# Patient Record
Sex: Female | Born: 1955 | Race: White | Hispanic: No | Smoking: Never smoker
Health system: Southern US, Community
[De-identification: ages and names within clinical notes are randomized; demographics above are authoritative.]

## PROBLEM LIST (undated history)

## (undated) DIAGNOSIS — C189 Malignant neoplasm of colon, unspecified: Secondary | ICD-10-CM

## (undated) HISTORY — PX: APPENDECTOMY: SHX54

## (undated) HISTORY — PX: CHOLECYSTECTOMY: SHX55

## (undated) HISTORY — DX: Malignant neoplasm of colon, unspecified: C18.9

## (undated) HISTORY — PX: ABDOMINAL HYSTERECTOMY: SHX81

## (undated) HISTORY — PX: TONSILLECTOMY: SUR1361

---

## 2007-02-12 ENCOUNTER — Emergency Department (HOSPITAL_COMMUNITY): Admission: EM | Admit: 2007-02-12 | Discharge: 2007-02-12 | Payer: Self-pay | Admitting: Emergency Medicine

## 2007-02-22 ENCOUNTER — Ambulatory Visit (HOSPITAL_COMMUNITY): Admission: RE | Admit: 2007-02-22 | Discharge: 2007-02-22 | Payer: Self-pay | Admitting: General Surgery

## 2007-02-22 ENCOUNTER — Encounter (INDEPENDENT_AMBULATORY_CARE_PROVIDER_SITE_OTHER): Payer: Self-pay | Admitting: General Surgery

## 2008-02-07 DIAGNOSIS — C189 Malignant neoplasm of colon, unspecified: Secondary | ICD-10-CM

## 2008-02-07 HISTORY — DX: Malignant neoplasm of colon, unspecified: C18.9

## 2008-03-09 HISTORY — PX: PARTIAL COLECTOMY: SHX5273

## 2008-07-02 ENCOUNTER — Ambulatory Visit (HOSPITAL_COMMUNITY): Admission: RE | Admit: 2008-07-02 | Discharge: 2008-07-02 | Payer: Self-pay | Admitting: Family Medicine

## 2009-07-12 ENCOUNTER — Ambulatory Visit (HOSPITAL_COMMUNITY): Admission: RE | Admit: 2009-07-12 | Discharge: 2009-07-12 | Payer: Self-pay | Admitting: Family Medicine

## 2010-06-21 ENCOUNTER — Other Ambulatory Visit (HOSPITAL_COMMUNITY): Payer: Self-pay | Admitting: Family Medicine

## 2010-06-21 DIAGNOSIS — Z139 Encounter for screening, unspecified: Secondary | ICD-10-CM

## 2010-06-21 NOTE — Op Note (Signed)
[name], [name]               ACCOUNT NO.:  [id number]   MEDICAL RECORD NO.:  [id number]          PATIENT TYPE:  AMB   LOCATION:  DAY                           FACILITY:  [facility]   PHYSICIAN:  Tilford Pillar, MD      DATE OF BIRTH:  1956-01-03   DATE OF PROCEDURE:  02/22/2007  DATE OF DISCHARGE:                               OPERATIVE REPORT   PREOPERATIVE DIAGNOSIS:  Cholelithiasis (sludge).   POSTOPERATIVE DIAGNOSIS:  Cholelithiasis (sludge).   PROCEDURE:  Laparoscopic cholecystectomy.   SURGEON:  Tilford Pillar, MD.   ANESTHESIA:  1. General endotracheal.  2. Local anesthetic 1% Sensorcaine plain.   ESTIMATED BLOOD LOSS:  Less than 100 mL.   SPECIMEN:  Gallbladder.   INDICATIONS:  The patient is a 55 year old female who presented to my  office as an outpatient with a history of symptomatology consistent with  biliary colic.  She had been evaluated and was determined to have what  was suspected to be small gallbladder stones, a.k.a sludge.  There was  discussion with the patient that although a majority of her symptoms  were consistent with biliary disease that some of her symptomatology may  be from other upper airway gastrointestinal etiologies and that complete  resolution of all her symptoms may not be possible from cholecystectomy.  However, the majority should improve.  The patient's questions and  concerns were addressed after the risks, benefits and alternatives for  laparoscopic possible open cholecystectomy were discussed with the  patient including, but not limited to, the possibility of infection,  bleeding, bile leak, small bowel injury or common bile duct injury as  well as the possibility of intraoperative cardiac or pulmonary events.  The patient was consented for the planned procedure.   OPERATION:  The patient was taken to the operating room, was placed in  the supine position on the operating table.  At this time, a general  anesthetic was administered  by Anesthesia.  At this point, the patient  was endotracheally intubated by Anesthesia and then her abdomen was  prepped and draped in the usual fashion.  At this time a stab incision  was created supraumbilically with an 11-blade scalpel.  Initial  dissection down to subcuticular tissue was carried out using a Kocher  clamp which was utilized to grasp the anterior abdominal wall fascia and  lift this anteriorly.  The Veress needle was inserted.  A saline drop  test was utilized to confirm intraperitoneal placement and  pneumoperitoneum was initiated.  Once sufficient pneumoperitoneum was  obtained the Veress needle was removed.  An 11 mm trocar was placed  through the umbilical trocar site and the umbilical incision over the  laparoscope allowing visualization of the trocar entry into the  abdominal cavity.  At this point the inner cannula was removed, the  laparoscope was reinserted, there was no evidence of any Veress needle  or trocar placement injury.  At this time, there were several adhesions  within this area and these were bluntly stripped using the camera to  gently widely lyse some of the adhesions in this area,  allowing for a  large enough window in the thin omental adhesions to allow visualization  of the surgical field.  At this point, the remaining trocars were placed  with an 11 mm trocar in the epigastrium, a 5 mm trocar in the midline  between the two 11 mm trocars, and a 5 mm trocar placed in the right  lateral abdominal wall.  At this point, the patient was placed in the  head up left lateral decubitus position.  The gallbladder fundus was  grasped with the radial grasper and lifted up and over the liver.  During this, a small cholecystotomy was created allowing some bile to  escape.  This was regrasped with a grasper to minimize the spillage of  bile.  At this point, the infundibulum was clearly identified.  The  peritoneum was bluntly stripped using a round dissector  off of the  infundibulum, allowing identification of the cystic duct as it entered  into the infundibulum.  A window was created behind the cystic duct.  Three Endoclips were placed proximally, one distally and this was  __________ divided between the two most distal clips.  Similarly, the  cystic artery was identified, a window was created behind this, two  Endoclips were placed proximally, one distally and the cystic artery was  divided between two most distal clips.  Electrocautery was then brought  into the field and was utilized to dissect the gallbladder free from the  gallbladder fossa.  Hemostasis was obtained using electrocautery.  At  this point, the gallbladder was placed in the EndoCatch bag which was  placed __________ right lobe of the liver.  At this point, a suction  catheter was brought into the field allowing aspiration of the spilled  bile.  Hemostasis was excellent although a piece of Surgicel was still  placed into the gallbladder fossa at this point.   At this point the patient was returned to the supine position.  An  Endoclose suture passing device was utilized to pass a #2-0 Vicryl at  both of the 11 mm trocar sites.  With these two sutures in place the  EndoCatch bag was grasped and was pulled through the epigastric trocar  site.  The gallbladder was removed in the intact EndoCatch bag and was  placed on the back table as a permanent specimen.  At this point,  pneumoperitoneum was evacuated, all trocars removed and the Vicryl  sutures were secure.  The skin edges were reapproximated with a #4-0  Monocryl and a running subcuticular suture.  Local anesthetic was  instilled and the skin was washed and dried with a moistened dry towel.  Benzoin was applied around all 4 trocar sites.  Half inch  Steri-Strips  were then placed over the incision and the drapes were removed.  The  patient was allowed to come out of general anesthetic and was  transferred to the Post  Anesthetic Care Unit in stable condition.  At  the conclusion of the procedure all instrument, sponge and needle counts  were correct, the patient tolerated the procedure well.      Tilford Pillar, MD  Electronically Signed     BZ/MEDQ  D:  02/22/2007  T:  02/22/2007  Job:  [id number]   cc:   Primary Care Physician of Record

## 2010-06-21 NOTE — H&P (Signed)
NAMELEILENE, DIPRIMA               ACCOUNT NO.:  [id number]   MEDICAL RECORD NO.:  [id number]          PATIENT TYPE:  AMB   LOCATION:  DAY                           FACILITY:  [facility]   PHYSICIAN:  Tilford Pillar, MD      DATE OF BIRTH:  1955/10/16   DATE OF ADMISSION:  DATE OF DISCHARGE:  LH                              HISTORY & PHYSICAL   CHIEF COMPLAINT:  Epigastric and substernal pain.   HISTORY OF PRESENT ILLNESS:  The patient is a 55 year old female who had  had a several day episode of epigastric and substernal pain.  She had  had similar symptoms in the past but never as significant.  She had  presented to the emergency department, suspecting a cardiac etiology.  This was worked up extensively and was suggested to be biliary in  nature.  She has had a history of symptoms that she had previously felt  to be related to ulcer disease.  She did have a prior workup for H.  pylori which was positive.  She was treated and responded appropriately  with posttreatment evaluation, demonstrating eradication of the H.  pylori.  Since then she has had a trial of Nexium which has worked in  the past for her abdominal pain; however, at this occurrence, she had no  relief.  She has not had any fever or chills.  She has had some nausea  with the pain but no episodes of emesis.  She has had some bloating  sensation with occasional increased flatus.  She has noted no  significant change with any particular food, but does state that p.o.  intake does tend to initially make the pain worse.  She had no relieving  features.  She has denied any changes with bowel habits, no  hematochezia, no melena, no acholic stools, no change with urination, no  hematuria or dysuria.   PAST MEDICAL HISTORY:  Unremarkable.   PAST SURGICAL HISTORY:  1. History of tonsils and adenoidectomies.  2. Appendectomy.  3. She had two C-sections.  4. Hysterectomy.  5. Midline infraumbilical incision.   MEDICATIONS:  She  is not currently on any medications.   ALLERGIES:  No known drug allergies.   SOCIAL HISTORY:  No tobacco use.  Occasional alcohol use.  No  recreational drug use.   PREGNANCIES:  She has had two pregnancies.   FAMILY HISTORY:  Positive for gallbladder disease in a sister and in a  grandmother.   REVIEW OF SYSTEMS:  CONSTITUTIONAL:  Unremarkable.  EYES:  Unremarkable.  EARS, NOSE, AND THROAT:  Occasional rhinorrhea.  RESPIRATORY:  Unremarkable.  CARDIOVASCULAR:  Unremarkable.  GASTROINTESTINAL:  Abdominal pain, nausea, and indigestion as per HPI.  GENITOURINARY:  Unremarkable.  MUSCULOSKELETAL:  Unremarkable.  SKIN:  Unremarkable.  ENDOCRINE:  Occasional cold or heat intolerance.  Occasionally lack of  energy.  NEUROLOGIC:  Unremarkable.   PHYSICAL EXAMINATION:  She is a healthy, although mildly obese female.  She is calm, in no acute distress.Marland Kitchen  HEENT:  Scalp:  No deformities, no masses.  Eyes:  Pupils equal, round,  and reactive.  Extraocular movements intact.  No scleral icterus or  conjunctival pallor is noted.  NECK:  Trachea is midline.  No cervical lymphadenopathy is apparent.  Marland Kitchen  PULMONARY:  Unlabored respiration, no wheezing.  No crackles.  No  rhonchi.  She is clear to auscultation and bilateral lung fields.  CARDIOVASCULAR:  Regular rate and rhythm.  No murmurs or gallops are  appreciated.  She has 2+ radial and dorsalis pedis pulses bilaterally.  ABDOMEN:  Positive bowel sounds.  Abdomen si soft.  She is nontender.  No pain to palpation.  No hernias are appreciated.  No masses are  elicited.  SKIN:  Warm and dry.   LABORATORY DATA:  Previous ultrasound to the right upper quadrant  demonstrated positive sludge.  No evidence of acute inflammation.  No  pericholecystic fluid.  No evidence of any common bile duct dilatation.   ASSESSMENT/PLAN:  Chronic cholecystitis, suspected cholelithiasis  (sludge).  At this point her symptomatology is suggestive of biliary   etiology.  The risks, benefits, and alternatives of a laparoscopic,  possible open cholecystectomy were discussed at length with the patient,  including the risks, but not limited to, infection, bleeding, common  bile duct injury, bile leak, small bowel injury, as well as the  possibility of intraoperative cardiac or pulmonary events.  As the  patient has had a prior history of upper abdominal pain which responded  to Nexium, but was different than the current episodes, it is suspected  she may have additional etiology of some of her symptoms from some  gastroesophageal reflux disease.  At this point I do feel that enough of  her symptoms are consistent with a biliary etiology that I would  recommend gallbladder removal if some of her symptoms do persist  following additional workup would be recommended for additional upper GI  etiology such as gastroesophageal reflux disease.  This was discussed  with the patient and the patient is comfortable with this and wishes to  proceed with surgery.  She will be scheduled at her earliest  convenience.      Tilford Pillar, MD  Electronically Signed     BZ/MEDQ  D:  02/14/2007  T:  02/15/2007  Job:  [id number]   cc:   Tilford Pillar, MD  Fax: [phone]   Dr. Kathyrn Sheriff [facility]

## 2010-07-18 ENCOUNTER — Ambulatory Visit (HOSPITAL_COMMUNITY)
Admission: RE | Admit: 2010-07-18 | Discharge: 2010-07-18 | Disposition: A | Discharge status: Home / Self Care | Payer: PRIVATE HEALTH INSURANCE | Source: Ambulatory Visit | Attending: Family Medicine | Admitting: Family Medicine

## 2010-07-18 DIAGNOSIS — Z139 Encounter for screening, unspecified: Secondary | ICD-10-CM

## 2010-07-18 DIAGNOSIS — Z1231 Encounter for screening mammogram for malignant neoplasm of breast: Secondary | ICD-10-CM | POA: Insufficient documentation

## 2010-10-27 LAB — COMPREHENSIVE METABOLIC PANEL
ALT: 47 — ABNORMAL HIGH
AST: 54 — ABNORMAL HIGH
Alkaline Phosphatase: 74
BUN: 8
CO2: 25
CO2: 26
Calcium: 9
Chloride: 106
GFR calc Af Amer: >60
Glucose, Bld: 108 — ABNORMAL HIGH
Glucose, Bld: 117 — ABNORMAL HIGH
Potassium: 3.6
Sodium: 139
Sodium: 142
Total Bilirubin: 0.6
Total Protein: 6.4

## 2010-10-27 LAB — DIFFERENTIAL
Basophils Absolute: 0
Basophils Relative: 0
Lymphocytes Relative: 19
Monocytes Absolute: 0.5
Monocytes Relative: 8
Neutro Abs: 3.8
Neutro Abs: 4.7

## 2010-10-27 LAB — CBC
HCT: 33.8 — ABNORMAL LOW
HCT: 35.4 — ABNORMAL LOW
HCT: 38.5
Hemoglobin: 11.8 — ABNORMAL LOW
Hemoglobin: 12.4
Hemoglobin: 12.9
MCHC: 35
Platelets: 221
WBC: 6.3

## 2010-10-27 LAB — BASIC METABOLIC PANEL
CO2: 25
Calcium: 9
Creatinine, Ser: 0.63
GFR calc Af Amer: >60
GFR calc non Af Amer: >60
Glucose, Bld: 110 — ABNORMAL HIGH
Potassium: 3.5

## 2010-10-27 LAB — LIPASE, BLOOD: Lipase: 50

## 2010-10-27 LAB — AMYLASE: Amylase: 56

## 2011-03-10 HISTORY — PX: COLONOSCOPY: SHX174

## 2011-07-17 ENCOUNTER — Other Ambulatory Visit (HOSPITAL_COMMUNITY): Payer: Self-pay | Admitting: Family Medicine

## 2011-07-17 DIAGNOSIS — Z139 Encounter for screening, unspecified: Secondary | ICD-10-CM

## 2011-07-21 ENCOUNTER — Ambulatory Visit (HOSPITAL_COMMUNITY)
Admission: RE | Admit: 2011-07-21 | Discharge: 2011-07-21 | Disposition: A | Discharge status: Home / Self Care | Payer: PRIVATE HEALTH INSURANCE | Source: Ambulatory Visit | Attending: Family Medicine | Admitting: Family Medicine

## 2011-07-21 DIAGNOSIS — Z139 Encounter for screening, unspecified: Secondary | ICD-10-CM

## 2011-07-21 DIAGNOSIS — Z1231 Encounter for screening mammogram for malignant neoplasm of breast: Secondary | ICD-10-CM | POA: Insufficient documentation

## 2011-08-30 ENCOUNTER — Encounter (HOSPITAL_COMMUNITY): Payer: PRIVATE HEALTH INSURANCE | Attending: Oncology | Admitting: Oncology

## 2011-08-30 ENCOUNTER — Encounter (HOSPITAL_COMMUNITY): Payer: Self-pay | Admitting: Oncology

## 2011-08-30 ENCOUNTER — Telehealth (HOSPITAL_COMMUNITY): Payer: Self-pay | Admitting: Oncology

## 2011-08-30 VITALS — BP 140/72 | HR 74 | Temp 98.2°F | Ht 62.5 in | Wt 158.5 lb

## 2011-08-30 DIAGNOSIS — Z09 Encounter for follow-up examination after completed treatment for conditions other than malignant neoplasm: Secondary | ICD-10-CM | POA: Insufficient documentation

## 2011-08-30 DIAGNOSIS — C189 Malignant neoplasm of colon, unspecified: Secondary | ICD-10-CM

## 2011-08-30 DIAGNOSIS — Z85038 Personal history of other malignant neoplasm of large intestine: Secondary | ICD-10-CM | POA: Insufficient documentation

## 2011-08-30 DIAGNOSIS — C180 Malignant neoplasm of cecum: Secondary | ICD-10-CM

## 2011-08-30 DIAGNOSIS — Z808 Family history of malignant neoplasm of other organs or systems: Secondary | ICD-10-CM

## 2011-08-30 LAB — COMPREHENSIVE METABOLIC PANEL
ALT: 33 U/L (ref 0–35)
AST: 29 U/L (ref 0–37)
Albumin: 4.8 g/dL (ref 3.5–5.2)
Alkaline Phosphatase: 75 U/L (ref 39–117)
Calcium: 8.2 mg/dL — ABNORMAL LOW (ref 8.4–10.5)
Chloride: 100 mEq/L (ref 96–112)
GFR calc Af Amer: >90 mL/min (ref >90)
GFR calc non Af Amer: >90 mL/min (ref >90)
Glucose, Bld: 79 mg/dL (ref 70–99)
Potassium: 3.8 mEq/L (ref 3.5–5.1)
Sodium: 139 mEq/L (ref 135–145)
Total Bilirubin: 0.4 mg/dL (ref 0.3–1.2)
Total Protein: 7.6 g/dL (ref 6.0–8.3)

## 2011-08-30 LAB — CBC
HCT: 41.9 % (ref 36.0–46.0)
MCHC: 34.8 g/dL (ref 30.0–36.0)
MCV: 86.4 fL (ref 78.0–100.0)
Platelets: 243 10*3/uL (ref 150–400)

## 2011-08-30 NOTE — Progress Notes (Signed)
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 Kinnie Feil at [facility]. In January 2010 she had a screening colonoscopy which detected this lesion. Thus far she has no evidence of recurrent disease and pathological staging was a pT1 N0 lesion. Her most recent colonoscopy was 03/30/2011 which showed no polyps no worrisome lesions and no diverticula. CEA levels have been said to be normal throughout her course.  Problem #2 mild excessive weight  Problem #3 history of TAH and BSO for benign disease namely fibroid tumors and excessive uterine bleeding causing chronic anemia.  Problem #4 history of T and A  Problem #5 history 2 C-sections  Problem #6 history of appendectomy  Problem #7 history of laparoscopic cholecystectomy  This is a very pleasant 56 year old Caucasian woman who is a nonsmoker and nondrinker. On routine screening colonoscopy she was found to have a grade 1 minimally invasive adenocarcinoma in a followup of the cecum with resection by Dr. Greggory Stallion at [facility]. Preoperative CT scan was negative for metastatic disease and she is also had a followup CT scan in September of 2011 which was also negative as well. Lab work has been unremarkable since then as well. She feels fine as negative oncologic review of systems. She had been going to the [facility] in [facility] which is closing down and she would like to transfer her care here.  She is the mother of 2 children a daughter and a son ages 63 and 85 respectively. They are in good health. She does however have a mother with a history of breast cancer, Anastasia Pall, who is my patient. Her mother's grandmother died of breast cancer in her 79s and her mother's sister was approximately 43 when diagnosed with breast cancer but is still alive in her 73s. This patient's father has a  history of prostate cancer. Her father is one brother died of lung cancer and one brother died of prostate cancer. The patient herself has a brother age 22 in good health and a sister age 48 in good health.  She works in [place] for a [employer].  She is divorced.  Vital signs are recorded her weight is 158 pounds and her height is 5 feet 2-1/2 inches her BMI is 28.6. She has no lymphadenopathy in the cervical supraclavicular infraclavicular axillary or inguinal nodes. Lungs are clear to auscultation and percussion. She has very tan skin throughout and states that she has some Native American blood in her. She denies use of the tanning bed but works outside in her garden quite often. HEENT exam is unremarkable. Pupils are equally round and reactive to light facial symmetry is intact and dental care is quite intact. She has no thyromegaly. Breast exam is negative for masses. Heart shows a regular rhythm and rate without murmur rub or gallop. Abdomen is soft and nontender without organomegaly all scars are well-healed. There is no distention bowel sounds are very normal. She has no peripheral edema the arms or legs. She is right handed. Pulses are 2+ and symmetrical. She is in no acute distress.  We are happy to follow her. She will have a colonoscopy she states 3 years from this past February. She is not sure where she will have it done but may consider one of the gastroenterologists locally. We will do her blood work today and see her back  in 6 months. If all is well then she will be out 4 full years from surgery and we may just go to lab work every 6 months with a followup appointment in 12 months or we may just see her every 6 months for another year. We may then just see her once a year.  I think the other issue I would like to consider is talking to our genetics counselor about her family history to see if she feels that she should be tested. She certainly needs annual mammography  which she is doing. I will see her in 6 months. She will call us next Tuesday to discuss the genetics issue since our counselor is out of the office this week.

## 2011-08-30 NOTE — Patient Instructions (Addendum)
[facility]  Discharge Instructions [name]  [id number] November 27, 1955 Dr. Glenford Peers RECOMMENDATIONS MADE BY THE CONSULTANT AND ANY TEST RESULTS WILL BE SENT TO YOUR REFERRING DOCTOR.   EXAM FINDINGS BY MD TODAY AND SIGNS AND SYMPTOMS TO REPORT TO CLINIC OR PRIMARY MD:  Exam per Dr. Mariel Sleet  INSTRUCTIONS GIVEN AND DISCUSSED: Consider for genetic counseling --we will talk to geneticist Call us next Tuesday for decision  SPECIAL INSTRUCTIONS/FOLLOW-UP: Labs today Call us on Friday for lab result Cea every 6 months  I acknowledge that I have been informed and understand all the instructions given to me and received a copy. I do not have any more questions at this time, but understand that I may call the Specialty Clinic at [facility] at [phone] during business hours should I have any further questions or need assistance in obtaining follow-up care.    __________________________________________  _____________  __________ Signature of Patient or Authorized Representative            Date                   Time    __________________________________________ Nurse's Signature

## 2011-08-30 NOTE — Telephone Encounter (Signed)
[employer]-[phone] PER AUTO CSR PTS PLAN IS EFFECTIVE. 02/06/10 TO PRESENT

## 2011-08-31 LAB — CEA: CEA: <0.5 ng/mL (ref 0.0–5.0)

## 2011-09-01 ENCOUNTER — Telehealth (HOSPITAL_COMMUNITY): Payer: Self-pay | Admitting: *Deleted

## 2011-09-01 NOTE — Telephone Encounter (Signed)
Pt notified to take one oscal daily due to calcium level 8.2.

## 2012-03-01 ENCOUNTER — Encounter (HOSPITAL_COMMUNITY): Payer: PRIVATE HEALTH INSURANCE | Attending: Oncology | Admitting: Oncology

## 2012-03-01 VITALS — BP 154/84 | HR 65 | Temp 97.4°F | Resp 18 | Wt 166.1 lb

## 2012-03-01 DIAGNOSIS — Z85038 Personal history of other malignant neoplasm of large intestine: Secondary | ICD-10-CM | POA: Insufficient documentation

## 2012-03-01 DIAGNOSIS — Z09 Encounter for follow-up examination after completed treatment for conditions other than malignant neoplasm: Secondary | ICD-10-CM | POA: Insufficient documentation

## 2012-03-01 DIAGNOSIS — E663 Overweight: Secondary | ICD-10-CM | POA: Insufficient documentation

## 2012-03-01 DIAGNOSIS — C189 Malignant neoplasm of colon, unspecified: Secondary | ICD-10-CM

## 2012-03-01 LAB — CBC WITH DIFFERENTIAL/PLATELET
Basophils Relative: 1 % (ref 0–1)
Lymphs Abs: 2 10*3/uL (ref 0.7–4.0)
MCHC: 34.2 g/dL (ref 30.0–36.0)
Neutro Abs: 3.8 10*3/uL (ref 1.7–7.7)
RBC: 4.73 MIL/uL (ref 3.87–5.11)
RDW: 12.6 % (ref 11.5–15.5)
WBC: 6.2 10*3/uL (ref 4.0–10.5)

## 2012-03-01 LAB — COMPREHENSIVE METABOLIC PANEL
BUN: 13 mg/dL (ref 6–23)
CO2: 27 mEq/L (ref 19–32)
Chloride: 101 mEq/L (ref 96–112)
Creatinine, Ser: 0.68 mg/dL (ref 0.50–1.10)
GFR calc non Af Amer: >90 mL/min (ref >90)
Glucose, Bld: 82 mg/dL (ref 70–99)
Sodium: 139 mEq/L (ref 135–145)

## 2012-03-01 NOTE — Patient Instructions (Addendum)
.  [facility] Discharge Instructions  RECOMMENDATIONS MADE BY THE CONSULTANT AND ANY TEST RESULTS WILL BE SENT TO YOUR REFERRING PHYSICIAN.  EXAM FINDINGS BY THE PHYSICIAN TODAY AND SIGNS OR SYMPTOMS TO REPORT TO CLINIC OR PRIMARY PHYSICIAN: Exam is good Discussed weight loss for better health and to decrease cancer risk    SPECIAL INSTRUCTIONS/FOLLOW-UP: cea in 6 months See [place] back in 1 year Report UNEXPLAINED weight loss, loss of appetite, abd pain that persists  Thank you for choosing [facility] to provide your oncology and hematology care.  To afford each patient quality time with our providers, please arrive at least 15 minutes before your scheduled appointment time.  With your help, our goal is to use those 15 minutes to complete the necessary work-up to ensure our physicians have the information they need to help with your evaluation and healthcare recommendations.    Effective January 1st, 2014, we ask that you re-schedule your appointment with our physicians should you arrive 10 or more minutes late for your appointment.  We strive to give you quality time with our providers, and arriving late affects you and other patients whose appointments are after yours.    Again, thank you for choosing [facility].  Our hope is that these requests will decrease the amount of time that you wait before being seen by our physicians.       _____________________________________________________________  Should you have questions after your visit to [facility], please contact our office at [phone] between the hours of 8:30 a.m. and 5:00 p.m.  Voicemails left after 4:30 p.m. will not be returned until the following business day.  For prescription refill requests, have your pharmacy contact our office with your prescription refill request.

## 2012-03-01 NOTE — Progress Notes (Signed)
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 Kinnie Feil at [facility]. In January 2010 she is screening colonoscopy which detected this lesion. She had presented with abdominal discomfort intermittently he was found an issue that H. pylori infection, then gallbladder disease with dysfunction. The latter was removed surgically and then the colon cancer was found. She has thus far no evidence for recurrence and her most recent colonoscopy was 03/30/2011 and she will need another colonoscopy 3 years and then. We will need to refer her to our local gastroenterologist. Problem #2 [place] excessive weight having gained 8 more pounds since being here in July and we talked once again about what she needs to do. She is not exercising on a regular basis and probably not eating the correct foods completely. I think she is well informed about what she should be heating but not always able to follow through. Problem #3 history 2 C-sections Problem #4 history of a complete Hysterectomy including TAH and BSO for benign disease, namely fibroid tumors and excessive uterine bleeding causing anemia. She states his could not be corrected without the hysterectomy Problem #5 history of a tonsillectomy Problem #6 history of laparoscopic cholecystectomy Problem #7 history of appendectomy Pleasant woman in no acute distress. Review of systems is negative. She's doing well. She is working full-time. She does not feel bad. Unfortunately however her weight is up 8 more pounds and she is 5 feet 2 inches tall  She is in no acute distress as mentioned. She has no lymphadenopathy. Breast exam is negative for masses. Lungs are clear to auscultation and percussion. Heart shows a regular rhythm and rate without murmur rub or gallop. Abdomen remains soft and nontender without again in  [place]. Bowel sounds are normal. She has no peripheral edema.  We will do blood work today and his CEA in 6 months and I will see her in one year. We will then go to followup one time per year for 2 more years. I do not think she needs CAT scans in followup at this time. She promises to work on her exercise, her being habits and her exercise. We will need to get her gastroenterologist consultation but we see her in 12 months.

## 2012-06-13 ENCOUNTER — Other Ambulatory Visit (HOSPITAL_COMMUNITY): Payer: Self-pay | Admitting: Family Medicine

## 2012-06-13 DIAGNOSIS — Z139 Encounter for screening, unspecified: Secondary | ICD-10-CM

## 2012-08-26 ENCOUNTER — Other Ambulatory Visit (HOSPITAL_COMMUNITY): Payer: PRIVATE HEALTH INSURANCE

## 2012-08-26 ENCOUNTER — Ambulatory Visit (HOSPITAL_COMMUNITY): Payer: PRIVATE HEALTH INSURANCE

## 2012-08-27 ENCOUNTER — Encounter (HOSPITAL_COMMUNITY): Payer: PRIVATE HEALTH INSURANCE | Attending: Hematology and Oncology

## 2012-08-27 ENCOUNTER — Ambulatory Visit (HOSPITAL_COMMUNITY)
Admission: RE | Admit: 2012-08-27 | Discharge: 2012-08-27 | Disposition: A | Discharge status: Home / Self Care | Payer: PRIVATE HEALTH INSURANCE | Source: Ambulatory Visit | Attending: Family Medicine | Admitting: Family Medicine

## 2012-08-27 DIAGNOSIS — Z139 Encounter for screening, unspecified: Secondary | ICD-10-CM

## 2012-08-27 DIAGNOSIS — Z1231 Encounter for screening mammogram for malignant neoplasm of breast: Secondary | ICD-10-CM | POA: Insufficient documentation

## 2012-08-27 DIAGNOSIS — C189 Malignant neoplasm of colon, unspecified: Secondary | ICD-10-CM | POA: Insufficient documentation

## 2012-08-27 LAB — CBC
HCT: 41.1 % (ref 36.0–46.0)
MCHC: 34.3 g/dL (ref 30.0–36.0)
MCV: 87.3 fL (ref 78.0–100.0)
Platelets: 229 10*3/uL (ref 150–400)
RBC: 4.71 MIL/uL (ref 3.87–5.11)

## 2012-08-27 LAB — COMPREHENSIVE METABOLIC PANEL
ALT: 16 U/L (ref 0–35)
AST: 18 U/L (ref 0–37)
CO2: 27 mEq/L (ref 19–32)
Calcium: 9.3 mg/dL (ref 8.4–10.5)
Creatinine, Ser: 0.63 mg/dL (ref 0.50–1.10)
GFR calc Af Amer: >90 mL/min (ref >90)
GFR calc non Af Amer: >90 mL/min (ref >90)
Sodium: 141 mEq/L (ref 135–145)
Total Protein: 7.2 g/dL (ref 6.0–8.3)

## 2012-08-27 NOTE — Progress Notes (Signed)
Labs drawn today for cbc,cmp,cea

## 2013-02-28 ENCOUNTER — Ambulatory Visit (HOSPITAL_COMMUNITY): Payer: PRIVATE HEALTH INSURANCE

## 2013-03-01 NOTE — Progress Notes (Signed)
This encounter was created in error - please disregard.

## 2013-03-06 ENCOUNTER — Encounter (HOSPITAL_COMMUNITY): Payer: PRIVATE HEALTH INSURANCE | Attending: Hematology and Oncology

## 2013-03-06 ENCOUNTER — Encounter (HOSPITAL_BASED_OUTPATIENT_CLINIC_OR_DEPARTMENT_OTHER): Payer: PRIVATE HEALTH INSURANCE

## 2013-03-06 ENCOUNTER — Encounter (HOSPITAL_COMMUNITY): Payer: Self-pay

## 2013-03-06 VITALS — BP 120/77 | HR 69 | Temp 97.8°F | Resp 16 | Wt 152.3 lb

## 2013-03-06 DIAGNOSIS — C189 Malignant neoplasm of colon, unspecified: Secondary | ICD-10-CM

## 2013-03-06 DIAGNOSIS — Z09 Encounter for follow-up examination after completed treatment for conditions other than malignant neoplasm: Secondary | ICD-10-CM | POA: Insufficient documentation

## 2013-03-06 DIAGNOSIS — Z85038 Personal history of other malignant neoplasm of large intestine: Secondary | ICD-10-CM

## 2013-03-06 LAB — COMPREHENSIVE METABOLIC PANEL
ALBUMIN: 4.5 g/dL (ref 3.5–5.2)
ALK PHOS: 75 U/L (ref 39–117)
ALT: 20 U/L (ref 0–35)
AST: 18 U/L (ref 0–37)
BILIRUBIN TOTAL: 0.4 mg/dL (ref 0.3–1.2)
BUN: 14 mg/dL (ref 6–23)
CHLORIDE: 103 meq/L (ref 96–112)
CO2: 28 meq/L (ref 19–32)
Calcium: 9.7 mg/dL (ref 8.4–10.5)
Creatinine, Ser: 0.72 mg/dL (ref 0.50–1.10)
GFR calc Af Amer: >90 mL/min (ref >90)
GFR calc non Af Amer: >90 mL/min (ref >90)
Glucose, Bld: 93 mg/dL (ref 70–99)
POTASSIUM: 4.4 meq/L (ref 3.7–5.3)
Sodium: 142 mEq/L (ref 137–147)
Total Protein: 7.8 g/dL (ref 6.0–8.3)

## 2013-03-06 LAB — CBC WITH DIFFERENTIAL/PLATELET
BASOS PCT: 0 % (ref 0–1)
Basophils Absolute: 0 10*3/uL (ref 0.0–0.1)
Eosinophils Absolute: 0.1 10*3/uL (ref 0.0–0.7)
Eosinophils Relative: 1 % (ref 0–5)
HCT: 43.6 % (ref 36.0–46.0)
HEMOGLOBIN: 15 g/dL (ref 12.0–15.0)
LYMPHS PCT: 33 % (ref 12–46)
Lymphs Abs: 2.5 10*3/uL (ref 0.7–4.0)
MCH: 30.4 pg (ref 26.0–34.0)
MCHC: 34.4 g/dL (ref 30.0–36.0)
MCV: 88.3 fL (ref 78.0–100.0)
MONOS PCT: 7 % (ref 3–12)
Monocytes Absolute: 0.5 10*3/uL (ref 0.1–1.0)
NEUTROS ABS: 4.4 10*3/uL (ref 1.7–7.7)
Neutrophils Relative %: 59 % (ref 43–77)
Platelets: 231 10*3/uL (ref 150–400)
RBC: 4.94 MIL/uL (ref 3.87–5.11)
RDW: 12.9 % (ref 11.5–15.5)
WBC: 7.5 10*3/uL (ref 4.0–10.5)

## 2013-03-06 LAB — FERRITIN: Ferritin: 95 ng/mL (ref 10–291)

## 2013-03-06 LAB — CEA: CEA: 0.5 ng/mL (ref 0.0–5.0)

## 2013-03-06 NOTE — Patient Instructions (Addendum)
[facility] Discharge Instructions  RECOMMENDATIONS MADE BY THE CONSULTANT AND ANY TEST RESULTS WILL BE SENT TO YOUR REFERRING PHYSICIAN.  EXAM FINDINGS BY THE PHYSICIAN TODAY AND SIGNS OR SYMPTOMS TO REPORT TO CLINIC OR PRIMARY PHYSICIAN: Exam and findings as discussed by Dr. Barnet Glasgow.  We will check labs today and if there are any concerns we will contact you.  Report changes in bowel habits, blood in your bowel movement, unexplained weight loss or other concerns. Have your Primary Care MD get you scheduled to see your Gastroenterologist.  MEDICATIONS PRESCRIBED:  none  INSTRUCTIONS/FOLLOW-UP: No follow-up needed. (will schedule follow-up in 6 months to see PA - if you wish you can cancel this appointment.)  Thank you for choosing [facility] to provide your oncology and hematology care.  To afford each patient quality time with our providers, please arrive at least 15 minutes before your scheduled appointment time.  With your help, our goal is to use those 15 minutes to complete the necessary work-up to ensure our physicians have the information they need to help with your evaluation and healthcare recommendations.    Effective January 1st, 2014, we ask that you re-schedule your appointment with our physicians should you arrive 10 or more minutes late for your appointment.  We strive to give you quality time with our providers, and arriving late affects you and other patients whose appointments are after yours.    Again, thank you for choosing [facility].  Our hope is that these requests will decrease the amount of time that you wait before being seen by our physicians.       _____________________________________________________________  Should you have questions after your visit to [facility], please contact our office at [phone] between the hours of 8:30 a.m. and 5:00 p.m.  Voicemails left after 4:30 p.m. will not be  returned until the following business day.  For prescription refill requests, have your pharmacy contact our office with your prescription refill request.

## 2013-03-06 NOTE — Progress Notes (Signed)
Labs drawn today for cbc/diff,cmp,ferr,cea

## 2013-03-06 NOTE — Progress Notes (Signed)
[place]  OFFICE PROGRESS NOTE  Quentin Cornwall, MD [address], [address] [place] [address]  DIAGNOSIS: Colon cancer - Plan: CBC with Differential, Comprehensive metabolic panel, CEA, Ferritin  Chief Complaint  Patient presents with  . Stage I carcinoma of the cecum    CURRENT THERAPY: Watchful expectation and surveillance  INTERVAL HISTORY: [name] 58 y.o. 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 Surgery was performed at [facility] on 03/12/2008.  She feels well with good appetite and no nausea, vomiting, diarrhea, constipation, melena, hematochezia, vaginal bleeding, hematuria, hemoptysis, or epistaxis. She denies any fever, significant night sweats, vaginal discharge or itching, lower extremity swelling or redness, PND, orthopnea, palpitations, skin rash, joint pain, headache, or seizures.  MEDICAL HISTORY: Past Medical History  Diagnosis Date  . Colon cancer 2010    INTERIM HISTORY:  does not have a problem list on file.   Stage I adenocarcinoma cecum occurring in a broadly irregular polyp just beyond the ileocecal valve with focally invasive disease and severe dysplasia status post surgeon 03/12/2008 by Dr. Anne Hahn at [facility]. In January 2010 she is screening colonoscopy which detected this lesion. She had presented with abdominal discomfort intermittently he was found an issue that H. pylori infection, then gallbladder disease with dysfunction. The latter was removed surgically and then the colon cancer was found. She has thus far no evidence for recurrence and her most recent colonoscopy was 03/30/2011 and she will need another colonoscopy in 3 years if negative.  ALLERGIES:  has No Known Allergies.  MEDICATIONS: has a current medication list which includes the following prescription(s): loratadine.  SURGICAL  HISTORY:  Past Surgical History  Procedure Laterality Date  . Partial colectomy  03/2008  . Appendectomy    . Tonsillectomy    . Abdominal hysterectomy    . Cholecystectomy    . Cesarean section      times 2  . Colonoscopy  03/2011    FAMILY HISTORY: family history is not on file.  SOCIAL HISTORY:  reports that she has never smoked. She has never used smokeless tobacco. She reports that she does not drink alcohol or use illicit drugs.  REVIEW OF SYSTEMS:  Other than that discussed above is noncontributory.  PHYSICAL EXAMINATION: ECOG PERFORMANCE STATUS: 0 - Asymptomatic  Blood pressure 120/77, pulse 69, temperature 97.8 F (36.6 C), temperature source Oral, resp. rate 16, weight 152 lb 4.8 oz (69.083 kg).  GENERAL:alert, no distress and comfortable SKIN: skin color, texture, turgor are normal, no rashes or significant lesions EYES: PERLA; Conjunctiva are pink and non-injected, sclera clear OROPHARYNX:no exudate, no erythema on lips, buccal mucosa, or tongue. NECK: supple, thyroid normal size, non-tender, without nodularity. No masses CHEST: No breast masses. LYMPH:  no palpable lymphadenopathy in the cervical, axillary or inguinal LUNGS: clear to auscultation and percussion with normal breathing effort HEART: regular rate & rhythm and no murmurs. ABDOMEN:abdomen soft, non-tender and normal bowel sounds MUSCULOSKELETAL:no cyanosis of digits and no clubbing. Range of motion normal.  NEURO: alert & oriented x 3 with fluent speech, no focal motor/sensory deficits   LABORATORY DATA: Office Visit on 03/06/2013  Component Date Value Range Status  . WBC 03/06/2013 7.5  4.0 - 10.5 K/uL Final  . RBC 03/06/2013 4.94  3.87 - 5.11 MIL/uL Final  . Hemoglobin 03/06/2013 15.0  12.0 - 15.0 g/dL Final  .  HCT 03/06/2013 43.6  36.0 - 46.0 % Final  . MCV 03/06/2013 88.3  78.0 - 100.0 fL Final  . MCH 03/06/2013 30.4  26.0 - 34.0 pg Final  . MCHC 03/06/2013 34.4  30.0 - 36.0 g/dL Final  . RDW  03/06/2013 12.9  11.5 - 15.5 % Final  . Platelets 03/06/2013 231  150 - 400 K/uL Final  . Neutrophils Relative % 03/06/2013 59  43 - 77 % Final  . Neutro Abs 03/06/2013 4.4  1.7 - 7.7 K/uL Final  . Lymphocytes Relative 03/06/2013 33  12 - 46 % Final  . Lymphs Abs 03/06/2013 2.5  0.7 - 4.0 K/uL Final  . Monocytes Relative 03/06/2013 7  3 - 12 % Final  . Monocytes Absolute 03/06/2013 0.5  0.1 - 1.0 K/uL Final  . Eosinophils Relative 03/06/2013 1  0 - 5 % Final  . Eosinophils Absolute 03/06/2013 0.1  0.0 - 0.7 K/uL Final  . Basophils Relative 03/06/2013 0  0 - 1 % Final  . Basophils Absolute 03/06/2013 0.0  0.0 - 0.1 K/uL Final  . Sodium 03/06/2013 142  137 - 147 mEq/L Final  . Potassium 03/06/2013 4.4  3.7 - 5.3 mEq/L Final  . Chloride 03/06/2013 103  96 - 112 mEq/L Final  . CO2 03/06/2013 28  19 - 32 mEq/L Final  . Glucose, Bld 03/06/2013 93  70 - 99 mg/dL Final  . BUN 03/06/2013 14  6 - 23 mg/dL Final  . Creatinine, Ser 03/06/2013 0.72  0.50 - 1.10 mg/dL Final  . Calcium 03/06/2013 9.7  8.4 - 10.5 mg/dL Final  . Total Protein 03/06/2013 7.8  6.0 - 8.3 g/dL Final  . Albumin 03/06/2013 4.5  3.5 - 5.2 g/dL Final  . AST 03/06/2013 18  0 - 37 U/L Final  . ALT 03/06/2013 20  0 - 35 U/L Final  . Alkaline Phosphatase 03/06/2013 75  39 - 117 U/L Final  . Total Bilirubin 03/06/2013 0.4  0.3 - 1.2 mg/dL Final  . GFR calc non Af Amer 03/06/2013 >90  >90 mL/min Final  . GFR calc Af Amer 03/06/2013 >90  >90 mL/min Final   Comment: (NOTE)                          The eGFR has been calculated using the CKD EPI equation.                          This calculation has not been validated in all clinical situations.                          eGFR's persistently <90 mL/min signify possible Chronic Kidney                          Disease.    PATHOLOGY: No new pathology.  Urinalysis No results found for this basename: colorurine,  appearanceur,  labspec,  phurine,  glucoseu,  hgbur,  bilirubinur,   ketonesur,  proteinur,  urobilinogen,  nitrite,  leukocytesur    RADIOGRAPHIC STUDIES:  MM Digital Screening Status: Final result            Study Result    *RADIOLOGY REPORT*  Clinical Data: Screening.  DIGITAL SCREENING BILATERAL MAMMOGRAM WITH CAD  Comparison: 07/02/2008  FINDINGS:  ACR Breast Density Category a: The breasts are almost entirely  fatty.  There is no suspicious dominant mass, architectural distortion, or  calcification to suggest malignancy.  Images were processed with CAD.  IMPRESSION:  No mammographic evidence of malignancy.  A result letter of this screening mammogram will be mailed directly  to the patient.  RECOMMENDATION:  Screening mammogram in one year. (Code:SM-B-01Y)  BI-RADS CATEGORY 1: Negative.  Original Report Authenticated By   .  ASSESSMENT:  #1. Stage I adenocarcinoma of the cecum found at the time of screening colonoscopy, status post right hemicolectomy on 03/12/2008 at [facility], last colonoscopy done in February 2013 and due in February of 2016. #2. Status post TAH and BSO for benign disease with fibroid tumors and dysfunctional uterine bleeding. #3. Status post 2 C-sections.   PLAN:  #1. Lab tests today and if normal no further appointments in this clinic. #2. Followup locally with gastroenterologist to be determined by her family physician. She is due again for colonoscopy in February of 2016. #3. If any abnormalities occur on today's lab tests, she will be contacted.   All questions were answered. The patient knows to call the clinic with any problems, questions or concerns. We can certainly see the patient much sooner if necessary.   I spent 25 minutes counseling the patient face to face. The total time spent in the appointment was 30 minutes.    Doroteo Bradford, MD 03/06/2013 11:24 AM

## 2013-07-30 ENCOUNTER — Other Ambulatory Visit (HOSPITAL_COMMUNITY): Payer: Self-pay | Admitting: Family Medicine

## 2013-07-30 DIAGNOSIS — Z1231 Encounter for screening mammogram for malignant neoplasm of breast: Secondary | ICD-10-CM

## 2013-09-01 ENCOUNTER — Ambulatory Visit (HOSPITAL_COMMUNITY)
Admission: RE | Admit: 2013-09-01 | Discharge: 2013-09-01 | Disposition: A | Discharge status: Home / Self Care | Payer: 59 | Source: Ambulatory Visit | Attending: Family Medicine | Admitting: Family Medicine

## 2013-09-01 DIAGNOSIS — Z1231 Encounter for screening mammogram for malignant neoplasm of breast: Secondary | ICD-10-CM | POA: Insufficient documentation

## 2013-09-03 ENCOUNTER — Ambulatory Visit (HOSPITAL_COMMUNITY): Payer: PRIVATE HEALTH INSURANCE | Admitting: Oncology

## 2014-08-20 ENCOUNTER — Other Ambulatory Visit (HOSPITAL_COMMUNITY): Payer: Self-pay | Admitting: Family Medicine

## 2014-08-20 DIAGNOSIS — Z1231 Encounter for screening mammogram for malignant neoplasm of breast: Secondary | ICD-10-CM

## 2014-09-07 ENCOUNTER — Ambulatory Visit (HOSPITAL_COMMUNITY)
Admission: RE | Admit: 2014-09-07 | Discharge: 2014-09-07 | Disposition: A | Discharge status: Home / Self Care | Payer: 59 | Source: Ambulatory Visit | Attending: Family Medicine | Admitting: Family Medicine

## 2014-09-07 DIAGNOSIS — Z1231 Encounter for screening mammogram for malignant neoplasm of breast: Secondary | ICD-10-CM

## 2015-08-26 ENCOUNTER — Other Ambulatory Visit (HOSPITAL_COMMUNITY): Payer: Self-pay | Admitting: Family Medicine

## 2015-08-26 DIAGNOSIS — Z1231 Encounter for screening mammogram for malignant neoplasm of breast: Secondary | ICD-10-CM

## 2015-09-13 ENCOUNTER — Ambulatory Visit (HOSPITAL_COMMUNITY)
Admission: RE | Admit: 2015-09-13 | Discharge: 2015-09-13 | Disposition: A | Discharge status: Home / Self Care | Payer: 59 | Source: Ambulatory Visit | Attending: Family Medicine | Admitting: Family Medicine

## 2015-09-13 DIAGNOSIS — Z1231 Encounter for screening mammogram for malignant neoplasm of breast: Secondary | ICD-10-CM | POA: Insufficient documentation

## 2016-08-10 ENCOUNTER — Other Ambulatory Visit (HOSPITAL_COMMUNITY): Payer: Self-pay | Admitting: Family Medicine

## 2016-08-10 DIAGNOSIS — Z1231 Encounter for screening mammogram for malignant neoplasm of breast: Secondary | ICD-10-CM

## 2016-09-18 ENCOUNTER — Ambulatory Visit (HOSPITAL_COMMUNITY)
Admission: RE | Admit: 2016-09-18 | Discharge: 2016-09-18 | Disposition: A | Discharge status: Home / Self Care | Payer: 59 | Source: Ambulatory Visit | Attending: Family Medicine | Admitting: Family Medicine

## 2016-09-18 DIAGNOSIS — Z1231 Encounter for screening mammogram for malignant neoplasm of breast: Secondary | ICD-10-CM | POA: Diagnosis not present

## 2017-08-31 ENCOUNTER — Other Ambulatory Visit (HOSPITAL_COMMUNITY): Payer: Self-pay | Admitting: Family Medicine

## 2017-08-31 DIAGNOSIS — Z1231 Encounter for screening mammogram for malignant neoplasm of breast: Secondary | ICD-10-CM

## 2017-10-01 ENCOUNTER — Ambulatory Visit (HOSPITAL_COMMUNITY)
Admission: RE | Admit: 2017-10-01 | Discharge: 2017-10-01 | Disposition: A | Discharge status: Home / Self Care | Payer: 59 | Source: Ambulatory Visit | Attending: Family Medicine | Admitting: Family Medicine

## 2017-10-01 DIAGNOSIS — Z1231 Encounter for screening mammogram for malignant neoplasm of breast: Secondary | ICD-10-CM | POA: Diagnosis not present

## 2018-09-30 ENCOUNTER — Other Ambulatory Visit (HOSPITAL_COMMUNITY): Payer: Self-pay | Admitting: Family Medicine

## 2018-09-30 DIAGNOSIS — Z1231 Encounter for screening mammogram for malignant neoplasm of breast: Secondary | ICD-10-CM

## 2018-10-11 ENCOUNTER — Encounter (HOSPITAL_COMMUNITY): Payer: Self-pay

## 2018-10-11 ENCOUNTER — Other Ambulatory Visit: Payer: Self-pay

## 2018-10-11 ENCOUNTER — Ambulatory Visit (HOSPITAL_COMMUNITY)
Admission: RE | Admit: 2018-10-11 | Discharge: 2018-10-11 | Disposition: A | Discharge status: Home / Self Care | Payer: 59 | Source: Ambulatory Visit | Attending: Family Medicine | Admitting: Family Medicine

## 2018-10-11 DIAGNOSIS — Z1231 Encounter for screening mammogram for malignant neoplasm of breast: Secondary | ICD-10-CM

## 2019-09-19 ENCOUNTER — Other Ambulatory Visit (HOSPITAL_COMMUNITY): Payer: Self-pay | Admitting: Family Medicine

## 2019-09-19 DIAGNOSIS — Z1231 Encounter for screening mammogram for malignant neoplasm of breast: Secondary | ICD-10-CM

## 2019-10-15 ENCOUNTER — Ambulatory Visit (HOSPITAL_COMMUNITY)
Admission: RE | Admit: 2019-10-15 | Discharge: 2019-10-15 | Disposition: A | Discharge status: Home / Self Care | Payer: 59 | Source: Ambulatory Visit | Attending: Family Medicine | Admitting: Family Medicine

## 2019-10-15 ENCOUNTER — Other Ambulatory Visit: Payer: Self-pay

## 2019-10-15 DIAGNOSIS — Z1231 Encounter for screening mammogram for malignant neoplasm of breast: Secondary | ICD-10-CM | POA: Diagnosis not present

## 2020-09-28 ENCOUNTER — Other Ambulatory Visit (HOSPITAL_COMMUNITY): Payer: Self-pay | Admitting: Family Medicine

## 2020-09-28 DIAGNOSIS — Z1231 Encounter for screening mammogram for malignant neoplasm of breast: Secondary | ICD-10-CM

## 2020-10-18 ENCOUNTER — Other Ambulatory Visit: Payer: Self-pay

## 2020-10-18 ENCOUNTER — Ambulatory Visit (HOSPITAL_COMMUNITY)
Admission: RE | Admit: 2020-10-18 | Discharge: 2020-10-18 | Disposition: A | Discharge status: Home / Self Care | Payer: 59 | Source: Ambulatory Visit | Attending: Family Medicine | Admitting: Family Medicine

## 2020-10-18 DIAGNOSIS — Z1231 Encounter for screening mammogram for malignant neoplasm of breast: Secondary | ICD-10-CM | POA: Diagnosis present

## 2021-07-31 IMAGING — MG DIGITAL SCREENING BILAT W/ TOMO W/ CAD
8 series · 9 of 24 positions shown · non-contrast
Comparison: Previous exam(s).

ACR Breast Density Category a: The breast tissue is almost entirely
fatty.

CLINICAL DATA: Screening.

EXAM:
DIGITAL SCREENING BILATERAL MAMMOGRAM WITH TOMO AND CAD

[R MLO synth-2D]
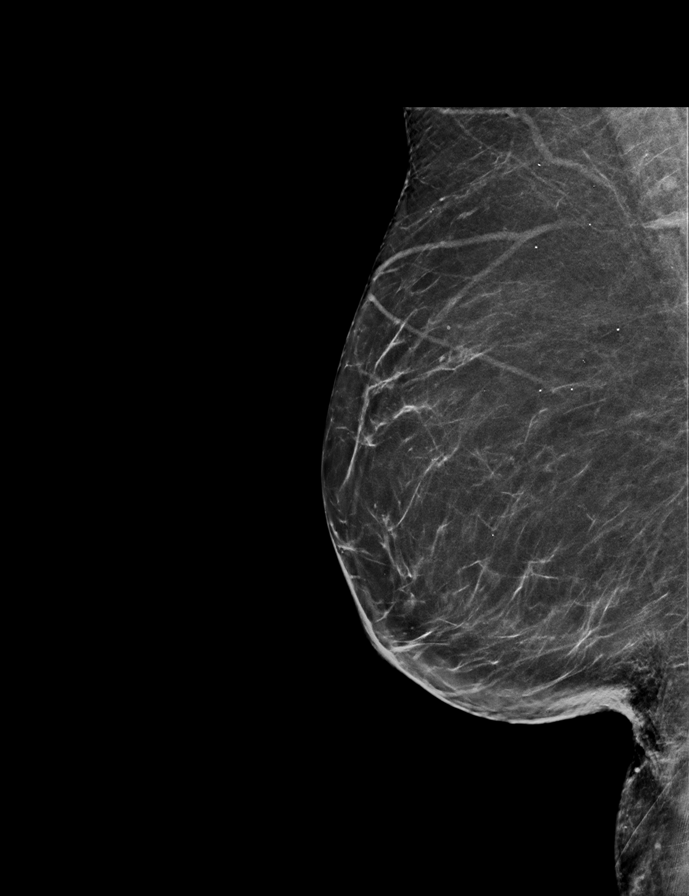

[L MLO synth-2D]
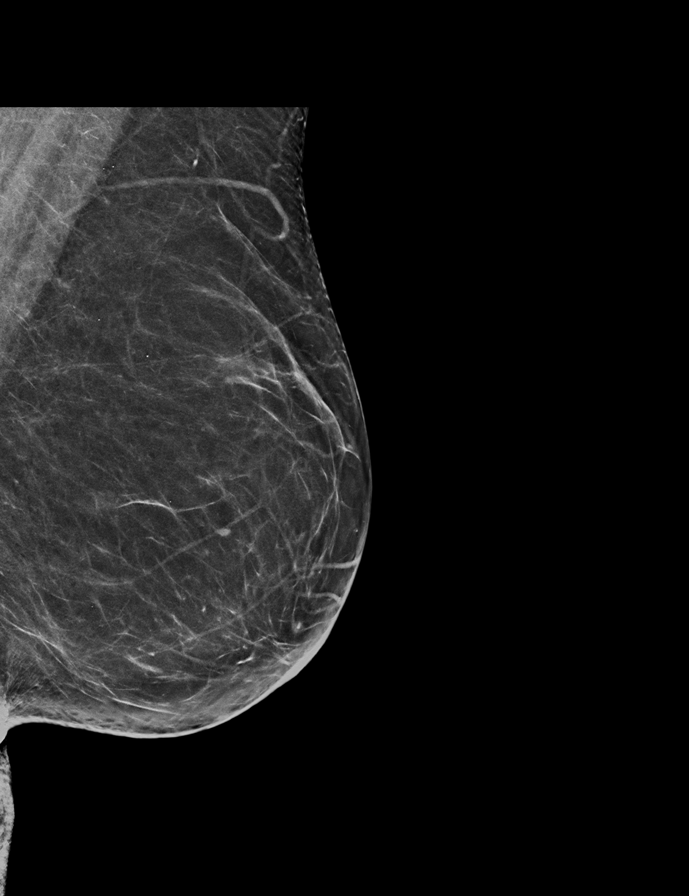

[L CC synth-2D]
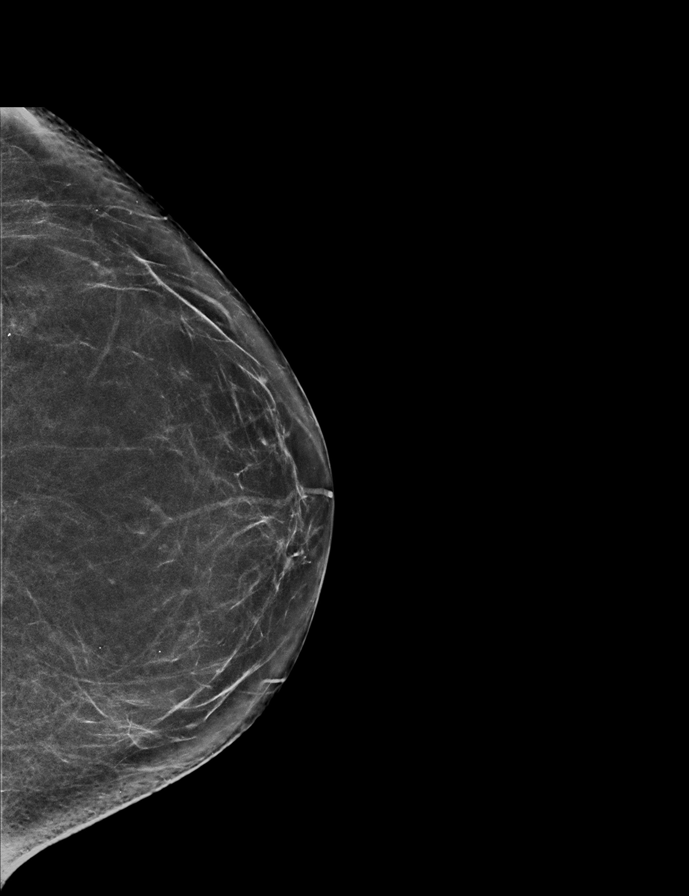

[R CC synth-2D]
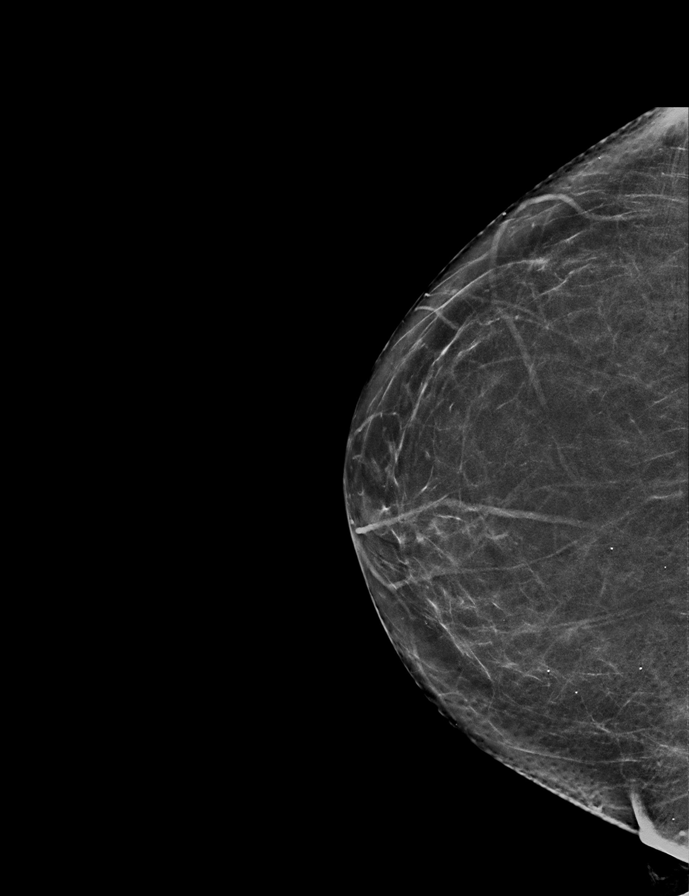

[R MLO tomo · 2 of 68 frames shown]
[frame 22/68]
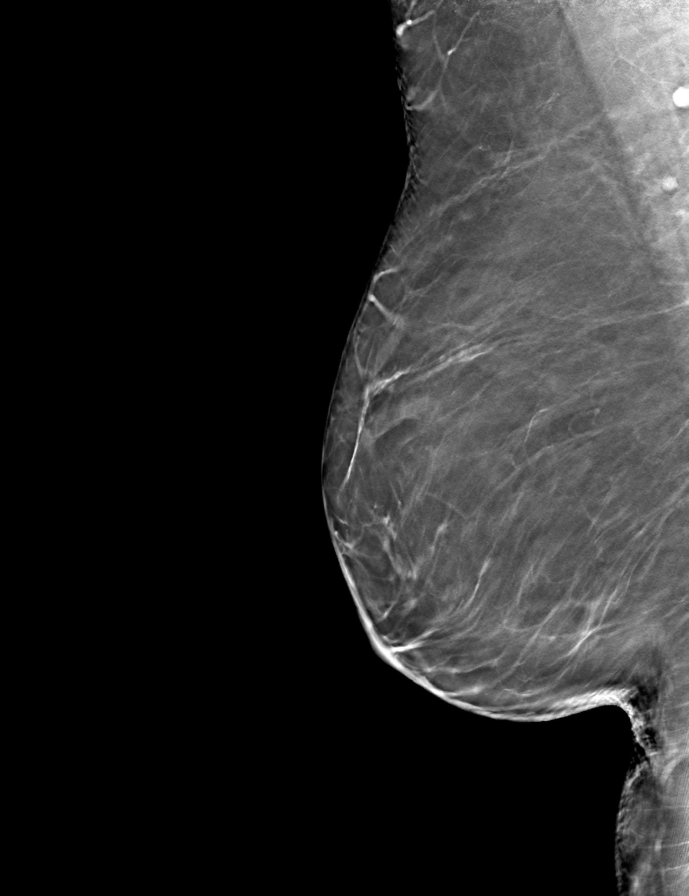
[frame 35/68]
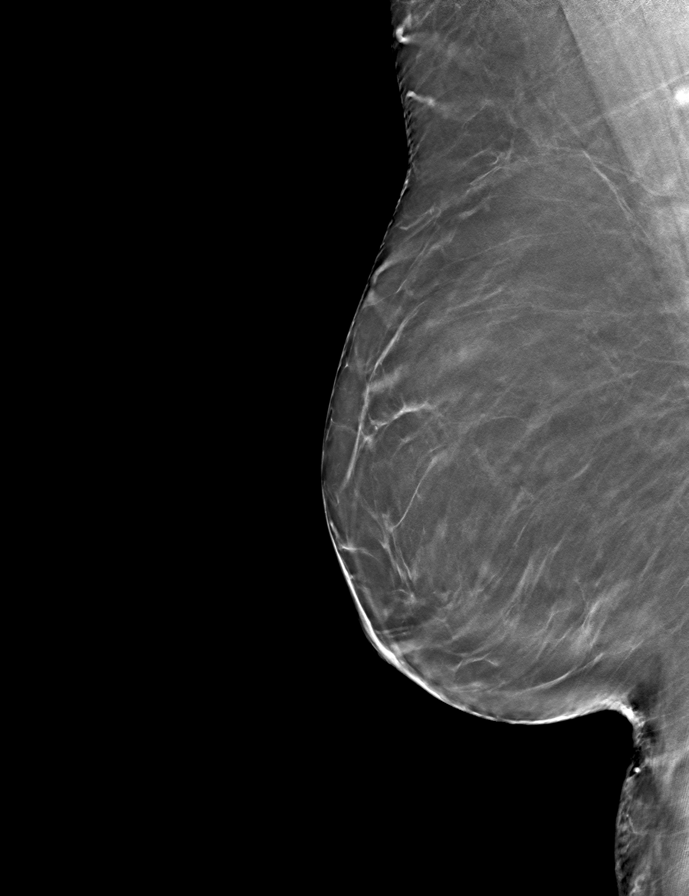

[L CC tomo · tomo slice 33/66.0]
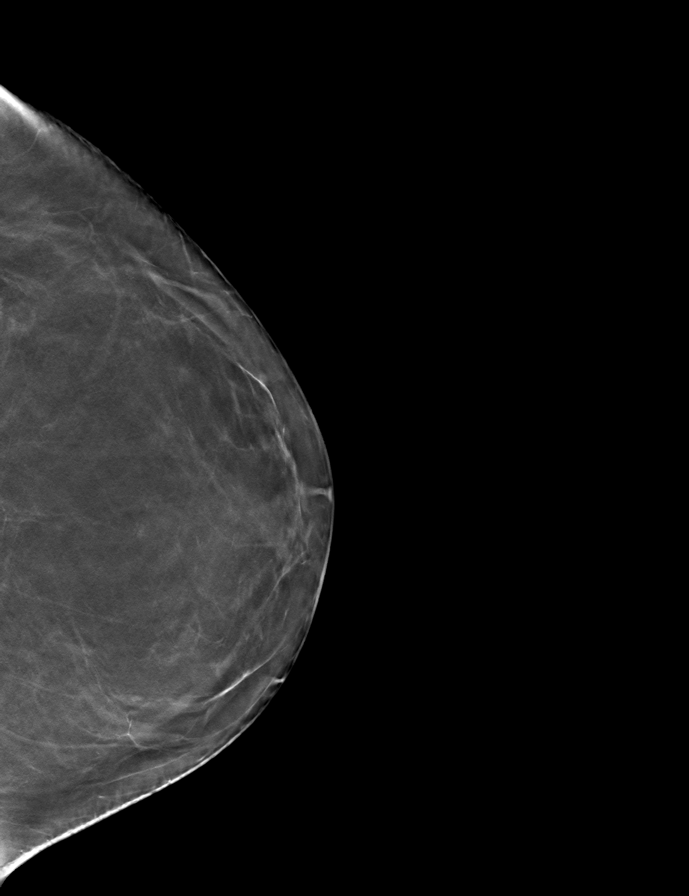

[L MLO tomo · tomo slice 34/67.0]
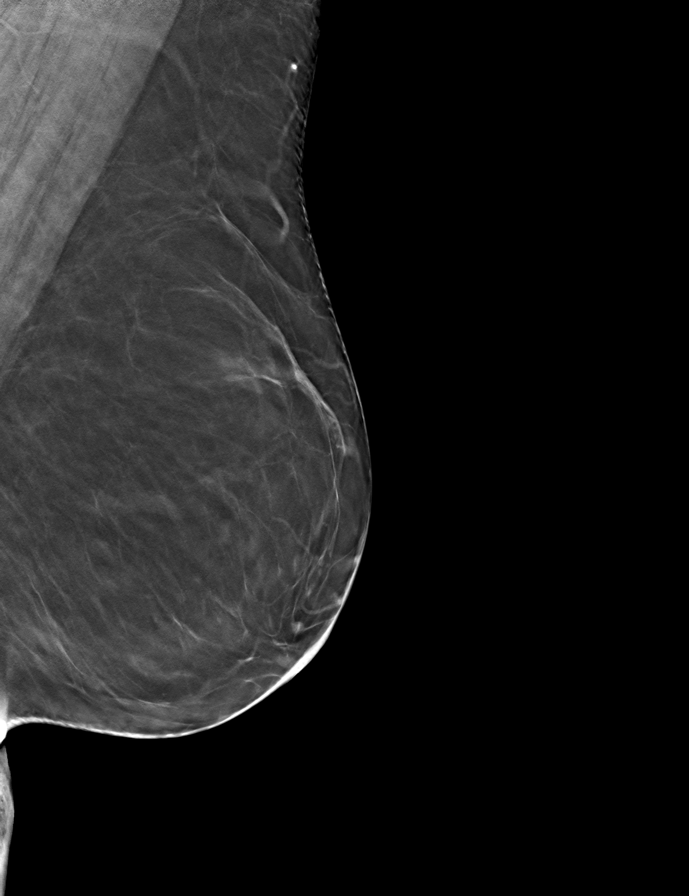

[R CC tomo · tomo slice 31/61.0]
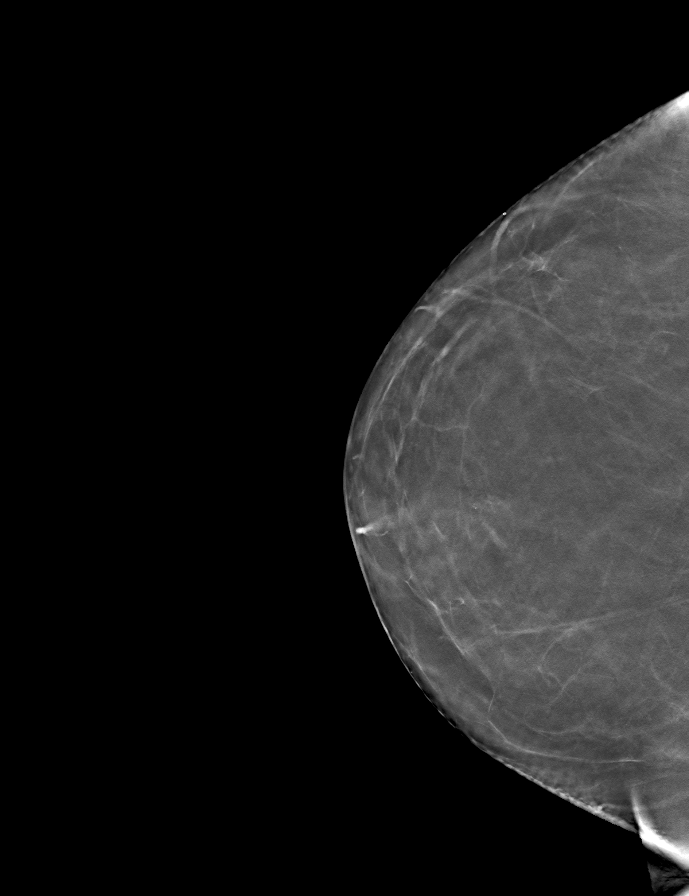

[9 of 24 positions shown; findings below may reference images not displayed]

FINDINGS: There are no findings suspicious for malignancy. Images were
processed with CAD.
IMPRESSION: No mammographic evidence of malignancy. A result letter of this
screening mammogram will be mailed directly to the patient.

RECOMMENDATION:
Screening mammogram in one year. (Code:8Y-[id number])

BI-RADS CATEGORY  1: Negative.

## 2021-09-22 ENCOUNTER — Other Ambulatory Visit (HOSPITAL_COMMUNITY): Payer: Self-pay | Admitting: Family Medicine

## 2021-09-22 DIAGNOSIS — Z1231 Encounter for screening mammogram for malignant neoplasm of breast: Secondary | ICD-10-CM

## 2021-10-20 ENCOUNTER — Ambulatory Visit (HOSPITAL_COMMUNITY)
Admission: RE | Admit: 2021-10-20 | Discharge: 2021-10-20 | Disposition: A | Discharge status: Home / Self Care | Payer: BC Managed Care – PPO | Source: Ambulatory Visit | Attending: Family Medicine | Admitting: Family Medicine

## 2021-10-20 DIAGNOSIS — Z1231 Encounter for screening mammogram for malignant neoplasm of breast: Secondary | ICD-10-CM | POA: Diagnosis present

## 2022-09-14 ENCOUNTER — Other Ambulatory Visit (HOSPITAL_COMMUNITY): Payer: Self-pay | Admitting: Family Medicine

## 2022-09-14 DIAGNOSIS — Z1231 Encounter for screening mammogram for malignant neoplasm of breast: Secondary | ICD-10-CM

## 2022-10-25 ENCOUNTER — Ambulatory Visit (HOSPITAL_COMMUNITY)
Admission: RE | Admit: 2022-10-25 | Discharge: 2022-10-25 | Disposition: A | Discharge status: Home / Self Care | Payer: BC Managed Care – PPO | Source: Ambulatory Visit | Attending: Family Medicine | Admitting: Family Medicine

## 2022-10-25 ENCOUNTER — Encounter (HOSPITAL_COMMUNITY): Payer: Self-pay

## 2022-10-25 DIAGNOSIS — Z1231 Encounter for screening mammogram for malignant neoplasm of breast: Secondary | ICD-10-CM | POA: Diagnosis present

## 2023-09-24 ENCOUNTER — Other Ambulatory Visit (HOSPITAL_COMMUNITY): Payer: Self-pay | Admitting: Family Medicine

## 2023-09-24 DIAGNOSIS — Z1231 Encounter for screening mammogram for malignant neoplasm of breast: Secondary | ICD-10-CM

## 2023-10-29 ENCOUNTER — Encounter (HOSPITAL_COMMUNITY): Payer: Self-pay

## 2023-10-29 ENCOUNTER — Ambulatory Visit (HOSPITAL_COMMUNITY)
Admission: RE | Admit: 2023-10-29 | Discharge: 2023-10-29 | Disposition: A | Discharge status: Home / Self Care | Source: Ambulatory Visit | Attending: Family Medicine | Admitting: Family Medicine

## 2023-10-29 DIAGNOSIS — Z1231 Encounter for screening mammogram for malignant neoplasm of breast: Secondary | ICD-10-CM | POA: Diagnosis present
